# Patient Record
Sex: Male | Born: 1950 | Race: White | Hispanic: No | Marital: Single
Health system: Southern US, Community
[De-identification: ages and names within clinical notes are randomized; demographics above are authoritative.]

---

## 2004-10-01 ENCOUNTER — Ambulatory Visit: Payer: Self-pay | Admitting: Otolaryngology

## 2004-10-12 ENCOUNTER — Ambulatory Visit: Payer: Self-pay | Admitting: Radiation Oncology

## 2004-10-12 ENCOUNTER — Ambulatory Visit: Payer: Self-pay | Admitting: Otolaryngology

## 2004-10-16 ENCOUNTER — Ambulatory Visit: Payer: Self-pay | Admitting: Oncology

## 2004-10-28 ENCOUNTER — Ambulatory Visit: Payer: Self-pay | Admitting: Radiation Oncology

## 2004-11-28 ENCOUNTER — Ambulatory Visit: Payer: Self-pay | Admitting: Radiation Oncology

## 2004-12-28 ENCOUNTER — Ambulatory Visit: Payer: Self-pay | Admitting: Radiation Oncology

## 2005-04-13 ENCOUNTER — Ambulatory Visit: Payer: Self-pay | Admitting: Oncology

## 2005-04-30 ENCOUNTER — Ambulatory Visit: Payer: Self-pay | Admitting: Oncology

## 2005-06-09 ENCOUNTER — Ambulatory Visit: Payer: Self-pay | Admitting: Otolaryngology

## 2007-08-29 ENCOUNTER — Ambulatory Visit: Payer: Self-pay | Admitting: Otolaryngology

## 2010-03-13 ENCOUNTER — Ambulatory Visit: Payer: Self-pay | Admitting: Otolaryngology

## 2010-03-18 ENCOUNTER — Inpatient Hospital Stay: Payer: Self-pay | Admitting: Otolaryngology

## 2011-03-30 ENCOUNTER — Ambulatory Visit: Payer: Self-pay | Admitting: Otolaryngology

## 2011-04-05 ENCOUNTER — Ambulatory Visit: Payer: Self-pay | Admitting: Otolaryngology

## 2011-04-13 ENCOUNTER — Ambulatory Visit: Payer: Self-pay | Admitting: Radiation Oncology

## 2011-04-13 ENCOUNTER — Ambulatory Visit: Payer: Self-pay | Admitting: Otolaryngology

## 2011-04-20 ENCOUNTER — Ambulatory Visit: Payer: Self-pay | Admitting: Otolaryngology

## 2011-04-22 ENCOUNTER — Inpatient Hospital Stay: Payer: Self-pay | Admitting: Otolaryngology

## 2011-04-29 LAB — PATHOLOGY REPORT

## 2011-05-01 ENCOUNTER — Ambulatory Visit: Payer: Self-pay | Admitting: Radiation Oncology

## 2011-05-09 ENCOUNTER — Ambulatory Visit: Payer: Self-pay | Admitting: Radiation Oncology

## 2011-06-28 ENCOUNTER — Ambulatory Visit: Payer: Self-pay | Admitting: Otolaryngology

## 2011-09-03 ENCOUNTER — Ambulatory Visit: Payer: Self-pay | Admitting: Otolaryngology

## 2011-09-03 LAB — CBC WITH DIFFERENTIAL/PLATELET
Basophil #: 0 10*3/uL (ref 0.0–0.1)
Eosinophil %: 2 %
HCT: 38.2 % — ABNORMAL LOW (ref 40.0–52.0)
HGB: 12.2 g/dL — ABNORMAL LOW (ref 13.0–18.0)
Lymphocyte #: 0.8 10*3/uL — ABNORMAL LOW (ref 1.0–3.6)
Lymphocyte %: 13.4 %
MCH: 26.7 pg (ref 26.0–34.0)
MCV: 84 fL (ref 80–100)
Monocyte #: 0.5 10*3/uL (ref 0.0–0.7)
Monocyte %: 8 %
Neutrophil #: 4.5 10*3/uL (ref 1.4–6.5)
Platelet: 339 10*3/uL (ref 150–440)
RBC: 4.56 10*6/uL (ref 4.40–5.90)
WBC: 5.9 10*3/uL (ref 3.8–10.6)

## 2011-09-03 LAB — BASIC METABOLIC PANEL
Calcium, Total: 9.6 mg/dL (ref 8.5–10.1)
Chloride: 101 mmol/L (ref 98–107)
Co2: 32 mmol/L (ref 21–32)
Creatinine: 0.77 mg/dL (ref 0.60–1.30)
EGFR (Non-African Amer.): >60
Sodium: 141 mmol/L (ref 136–145)

## 2011-09-03 LAB — TSH: Thyroid Stimulating Horm: 8.34 u[IU]/mL — ABNORMAL HIGH

## 2011-11-03 ENCOUNTER — Ambulatory Visit: Payer: Self-pay | Admitting: Otolaryngology

## 2011-11-03 LAB — CBC WITH DIFFERENTIAL/PLATELET
Basophil #: 0 10*3/uL (ref 0.0–0.1)
Basophil %: 0.3 %
Eosinophil %: 2 %
HCT: 35.9 % — ABNORMAL LOW (ref 40.0–52.0)
HGB: 11.5 g/dL — ABNORMAL LOW (ref 13.0–18.0)
Lymphocyte #: 0.7 10*3/uL — ABNORMAL LOW (ref 1.0–3.6)
MCH: 26.4 pg (ref 26.0–34.0)
MCHC: 32 g/dL (ref 32.0–36.0)
MCV: 83 fL (ref 80–100)
Monocyte #: 0.6 10*3/uL (ref 0.0–0.7)
Monocyte %: 8.9 %
Neutrophil #: 5 10*3/uL (ref 1.4–6.5)
Platelet: 304 10*3/uL (ref 150–440)
RBC: 4.34 10*6/uL — ABNORMAL LOW (ref 4.40–5.90)
WBC: 6.4 10*3/uL (ref 3.8–10.6)

## 2011-11-03 LAB — BASIC METABOLIC PANEL
Anion Gap: 11 (ref 7–16)
Chloride: 104 mmol/L (ref 98–107)
Co2: 30 mmol/L (ref 21–32)
Creatinine: 0.79 mg/dL (ref 0.60–1.30)
Glucose: 115 mg/dL — ABNORMAL HIGH (ref 65–99)
Osmolality: 294 (ref 275–301)
Potassium: 4 mmol/L (ref 3.5–5.1)

## 2011-11-08 ENCOUNTER — Inpatient Hospital Stay: Payer: Self-pay | Admitting: Otolaryngology

## 2011-11-09 LAB — CBC WITH DIFFERENTIAL/PLATELET
Basophil %: 0.2 %
Eosinophil #: 0.1 10*3/uL (ref 0.0–0.7)
Eosinophil %: 0.8 %
HCT: 33.8 % — ABNORMAL LOW (ref 40.0–52.0)
Lymphocyte #: 0.8 10*3/uL — ABNORMAL LOW (ref 1.0–3.6)
Lymphocyte %: 10.5 %
MCH: 26.8 pg (ref 26.0–34.0)
MCHC: 32.4 g/dL (ref 32.0–36.0)
MCV: 83 fL (ref 80–100)
Neutrophil #: 5.7 10*3/uL (ref 1.4–6.5)
Neutrophil %: 79.7 %
Platelet: 279 10*3/uL (ref 150–440)

## 2011-11-09 LAB — BASIC METABOLIC PANEL
BUN: 10 mg/dL (ref 7–18)
Chloride: 100 mmol/L (ref 98–107)
Co2: 31 mmol/L (ref 21–32)
Osmolality: 278 (ref 275–301)
Potassium: 3.8 mmol/L (ref 3.5–5.1)
Sodium: 139 mmol/L (ref 136–145)

## 2011-11-10 LAB — PATHOLOGY REPORT

## 2011-11-24 ENCOUNTER — Ambulatory Visit: Payer: Self-pay | Admitting: Oncology

## 2011-11-29 ENCOUNTER — Ambulatory Visit: Payer: Self-pay | Admitting: Oncology

## 2011-12-01 ENCOUNTER — Ambulatory Visit: Payer: Self-pay | Admitting: Oncology

## 2011-12-02 ENCOUNTER — Ambulatory Visit: Payer: Self-pay | Admitting: Oncology

## 2011-12-13 LAB — CBC CANCER CENTER
Basophil #: 0 x10 3/mm (ref 0.0–0.1)
Basophil %: 0.5 %
Eosinophil #: 0.2 x10 3/mm (ref 0.0–0.7)
HCT: 38.1 % — ABNORMAL LOW (ref 40.0–52.0)
Lymphocyte #: 0.7 x10 3/mm — ABNORMAL LOW (ref 1.0–3.6)
MCH: 26.5 pg (ref 26.0–34.0)
MCHC: 32 g/dL (ref 32.0–36.0)
MCV: 83 fL (ref 80–100)
Neutrophil #: 3.7 x10 3/mm (ref 1.4–6.5)
Neutrophil %: 71.5 %
RBC: 4.6 10*6/uL (ref 4.40–5.90)
WBC: 5.2 x10 3/mm (ref 3.8–10.6)

## 2011-12-13 LAB — BASIC METABOLIC PANEL
Calcium, Total: 9.5 mg/dL (ref 8.5–10.1)
Chloride: 103 mmol/L (ref 98–107)
Co2: 32 mmol/L (ref 21–32)
EGFR (Non-African Amer.): >60
Glucose: 102 mg/dL — ABNORMAL HIGH (ref 65–99)
Osmolality: 285 (ref 275–301)
Sodium: 141 mmol/L (ref 136–145)

## 2011-12-13 LAB — MAGNESIUM: Magnesium: 1.7 mg/dL — ABNORMAL LOW

## 2011-12-16 LAB — COMPREHENSIVE METABOLIC PANEL
Albumin: 3.7 g/dL (ref 3.4–5.0)
Alkaline Phosphatase: 81 U/L (ref 50–136)
Anion Gap: 6 — ABNORMAL LOW (ref 7–16)
Chloride: 102 mmol/L (ref 98–107)
Co2: 31 mmol/L (ref 21–32)
EGFR (Non-African Amer.): >60
Glucose: 94 mg/dL (ref 65–99)
Potassium: 4.1 mmol/L (ref 3.5–5.1)
SGOT(AST): 32 U/L (ref 15–37)

## 2011-12-16 LAB — CBC CANCER CENTER
Basophil #: 0 x10 3/mm (ref 0.0–0.1)
Basophil %: 0.7 %
Eosinophil #: 0.2 x10 3/mm (ref 0.0–0.7)
MCV: 83 fL (ref 80–100)
RDW: 18.5 % — ABNORMAL HIGH (ref 11.5–14.5)
WBC: 5 x10 3/mm (ref 3.8–10.6)

## 2011-12-23 LAB — CBC CANCER CENTER
Basophil #: 0 x10 3/mm (ref 0.0–0.1)
Basophil %: 0.4 %
Eosinophil #: 0.1 x10 3/mm (ref 0.0–0.7)
HCT: 38 % — ABNORMAL LOW (ref 40.0–52.0)
Lymphocyte %: 12.9 %
MCHC: 32 g/dL (ref 32.0–36.0)
MCV: 82 fL (ref 80–100)
Neutrophil #: 4.8 x10 3/mm (ref 1.4–6.5)
Neutrophil %: 75.6 %
Platelet: 246 x10 3/mm (ref 150–440)
RBC: 4.63 10*6/uL (ref 4.40–5.90)
RDW: 18.9 % — ABNORMAL HIGH (ref 11.5–14.5)
WBC: 6.3 x10 3/mm (ref 3.8–10.6)

## 2011-12-29 ENCOUNTER — Ambulatory Visit: Payer: Self-pay | Admitting: Oncology

## 2011-12-30 LAB — CBC CANCER CENTER
Basophil #: 0 x10 3/mm (ref 0.0–0.1)
Eosinophil #: 0.1 x10 3/mm (ref 0.0–0.7)
Eosinophil %: 2 %
HCT: 37.3 % — ABNORMAL LOW (ref 40.0–52.0)
HGB: 12 g/dL — ABNORMAL LOW (ref 13.0–18.0)
Lymphocyte #: 0.6 x10 3/mm — ABNORMAL LOW (ref 1.0–3.6)
Lymphocyte %: 9.7 %
MCH: 26.8 pg (ref 26.0–34.0)
MCV: 83 fL (ref 80–100)
Monocyte #: 0.5 x10 3/mm (ref 0.2–1.0)
Neutrophil #: 4.5 x10 3/mm (ref 1.4–6.5)
Neutrophil %: 79.5 %
RBC: 4.48 10*6/uL (ref 4.40–5.90)
RDW: 19.5 % — ABNORMAL HIGH (ref 11.5–14.5)
WBC: 5.7 x10 3/mm (ref 3.8–10.6)

## 2012-01-06 LAB — COMPREHENSIVE METABOLIC PANEL
Alkaline Phosphatase: 100 U/L (ref 50–136)
Bilirubin,Total: 0.2 mg/dL (ref 0.2–1.0)
Chloride: 102 mmol/L (ref 98–107)
Co2: 33 mmol/L — ABNORMAL HIGH (ref 21–32)
EGFR (African American): >60
EGFR (Non-African Amer.): >60
Osmolality: 290 (ref 275–301)
Potassium: 3.6 mmol/L (ref 3.5–5.1)
SGOT(AST): 27 U/L (ref 15–37)
Sodium: 144 mmol/L (ref 136–145)
Total Protein: 7.2 g/dL (ref 6.4–8.2)

## 2012-01-06 LAB — CBC CANCER CENTER
Eosinophil #: 0.1 x10 3/mm (ref 0.0–0.7)
Eosinophil %: 3.3 %
HCT: 38.8 % — ABNORMAL LOW (ref 40.0–52.0)
Lymphocyte #: 0.6 x10 3/mm — ABNORMAL LOW (ref 1.0–3.6)
Lymphocyte %: 17.3 %
MCH: 26.9 pg (ref 26.0–34.0)
MCHC: 32.2 g/dL (ref 32.0–36.0)
MCV: 84 fL (ref 80–100)
Monocyte #: 0.6 x10 3/mm (ref 0.2–1.0)
Monocyte %: 15.8 %
Neutrophil %: 62.8 %
Platelet: 235 x10 3/mm (ref 150–440)
RBC: 4.64 10*6/uL (ref 4.40–5.90)
RDW: 19.6 % — ABNORMAL HIGH (ref 11.5–14.5)
WBC: 3.7 x10 3/mm — ABNORMAL LOW (ref 3.8–10.6)

## 2012-01-12 LAB — COMPREHENSIVE METABOLIC PANEL
Albumin: 3.6 g/dL (ref 3.4–5.0)
Alkaline Phosphatase: 106 U/L (ref 50–136)
Anion Gap: 7 (ref 7–16)
BUN: 25 mg/dL — ABNORMAL HIGH (ref 7–18)
Calcium, Total: 9 mg/dL (ref 8.5–10.1)
Co2: 30 mmol/L (ref 21–32)
Creatinine: 0.8 mg/dL (ref 0.60–1.30)
EGFR (African American): >60
Potassium: 4.3 mmol/L (ref 3.5–5.1)
SGPT (ALT): 54 U/L
Sodium: 138 mmol/L (ref 136–145)

## 2012-01-12 LAB — CBC CANCER CENTER
Basophil %: 0.6 %
Eosinophil #: 0.1 x10 3/mm (ref 0.0–0.7)
Lymphocyte #: 0.8 x10 3/mm — ABNORMAL LOW (ref 1.0–3.6)
Lymphocyte %: 15.4 %
MCH: 27 pg (ref 26.0–34.0)
MCHC: 32.8 g/dL (ref 32.0–36.0)
MCV: 82 fL (ref 80–100)
Monocyte #: 0.5 x10 3/mm (ref 0.2–1.0)
Platelet: 206 x10 3/mm (ref 150–440)
RDW: 19.4 % — ABNORMAL HIGH (ref 11.5–14.5)

## 2012-01-20 LAB — CBC CANCER CENTER
Basophil #: 0 x10 3/mm (ref 0.0–0.1)
Basophil %: 0.4 %
HCT: 36.6 % — ABNORMAL LOW (ref 40.0–52.0)
HGB: 11.9 g/dL — ABNORMAL LOW (ref 13.0–18.0)
Lymphocyte #: 0.6 x10 3/mm — ABNORMAL LOW (ref 1.0–3.6)
Lymphocyte %: 13.8 %
MCH: 27.2 pg (ref 26.0–34.0)
Monocyte %: 12.1 %
Neutrophil #: 3.1 x10 3/mm (ref 1.4–6.5)
Platelet: 158 x10 3/mm (ref 150–440)
RDW: 19.8 % — ABNORMAL HIGH (ref 11.5–14.5)
WBC: 4.3 x10 3/mm (ref 3.8–10.6)

## 2012-01-27 LAB — CBC CANCER CENTER
Basophil %: 0.5 %
Eosinophil %: 2.1 %
HCT: 36.6 % — ABNORMAL LOW (ref 40.0–52.0)
HGB: 11.8 g/dL — ABNORMAL LOW (ref 13.0–18.0)
Lymphocyte #: 0.6 x10 3/mm — ABNORMAL LOW (ref 1.0–3.6)
Lymphocyte %: 20.7 %
MCHC: 32.3 g/dL (ref 32.0–36.0)
MCV: 84 fL (ref 80–100)
Monocyte #: 0.5 x10 3/mm (ref 0.2–1.0)
Monocyte %: 17.7 %
Platelet: 210 x10 3/mm (ref 150–440)
RBC: 4.35 10*6/uL — ABNORMAL LOW (ref 4.40–5.90)
WBC: 2.7 x10 3/mm — ABNORMAL LOW (ref 3.8–10.6)

## 2012-01-29 ENCOUNTER — Ambulatory Visit: Payer: Self-pay | Admitting: Oncology

## 2012-01-31 ENCOUNTER — Ambulatory Visit: Payer: Self-pay | Admitting: Oncology

## 2012-02-03 LAB — CBC CANCER CENTER
Basophil %: 0.3 %
Eosinophil #: 0.1 x10 3/mm (ref 0.0–0.7)
Eosinophil %: 1.4 %
Lymphocyte #: 0.7 x10 3/mm — ABNORMAL LOW (ref 1.0–3.6)
Lymphocyte %: 18.8 %
MCH: 27.5 pg (ref 26.0–34.0)
MCHC: 32.5 g/dL (ref 32.0–36.0)
Monocyte #: 0.5 x10 3/mm (ref 0.2–1.0)
Neutrophil #: 2.6 x10 3/mm (ref 1.4–6.5)
RBC: 4.4 10*6/uL (ref 4.40–5.90)
WBC: 4 x10 3/mm (ref 3.8–10.6)

## 2012-02-03 LAB — COMPREHENSIVE METABOLIC PANEL
Alkaline Phosphatase: 103 U/L (ref 50–136)
BUN: 23 mg/dL — ABNORMAL HIGH (ref 7–18)
Bilirubin,Total: 0.2 mg/dL (ref 0.2–1.0)
Chloride: 104 mmol/L (ref 98–107)
Co2: 31 mmol/L (ref 21–32)
Osmolality: 287 (ref 275–301)
Potassium: 4.1 mmol/L (ref 3.5–5.1)
SGPT (ALT): 33 U/L

## 2012-02-15 ENCOUNTER — Ambulatory Visit: Payer: Self-pay | Admitting: Otolaryngology

## 2012-02-21 ENCOUNTER — Ambulatory Visit: Payer: Self-pay | Admitting: Otolaryngology

## 2012-02-28 ENCOUNTER — Ambulatory Visit: Payer: Self-pay | Admitting: Oncology

## 2012-03-30 ENCOUNTER — Ambulatory Visit: Payer: Self-pay | Admitting: Oncology

## 2012-04-05 ENCOUNTER — Ambulatory Visit: Payer: Self-pay | Admitting: Oncology

## 2012-04-06 ENCOUNTER — Ambulatory Visit: Payer: Self-pay | Admitting: Oncology

## 2012-04-29 ENCOUNTER — Emergency Department: Payer: Self-pay | Admitting: Emergency Medicine

## 2012-04-29 LAB — CBC
HGB: 13.1 g/dL (ref 13.0–18.0)
MCH: 29.9 pg (ref 26.0–34.0)
MCV: 88 fL (ref 80–100)
RBC: 4.39 10*6/uL — ABNORMAL LOW (ref 4.40–5.90)
RDW: 14.9 % — ABNORMAL HIGH (ref 11.5–14.5)
WBC: 11.3 10*3/uL — ABNORMAL HIGH (ref 3.8–10.6)

## 2012-04-29 LAB — URINALYSIS, COMPLETE
Bilirubin,UR: NEGATIVE
Blood: NEGATIVE
Glucose,UR: NEGATIVE mg/dL (ref 0–75)
Leukocyte Esterase: NEGATIVE
Protein: NEGATIVE
RBC,UR: 2 /HPF (ref 0–5)
Squamous Epithelial: NONE SEEN
WBC UR: <1 /HPF (ref 0–5)

## 2012-04-29 LAB — COMPREHENSIVE METABOLIC PANEL
Alkaline Phosphatase: 103 U/L (ref 50–136)
Anion Gap: 8 (ref 7–16)
Bilirubin,Total: 0.2 mg/dL (ref 0.2–1.0)
Calcium, Total: 9.7 mg/dL (ref 8.5–10.1)
Chloride: 101 mmol/L (ref 98–107)
Co2: 29 mmol/L (ref 21–32)
Creatinine: 0.72 mg/dL (ref 0.60–1.30)
EGFR (African American): >60
EGFR (Non-African Amer.): >60
Glucose: 122 mg/dL — ABNORMAL HIGH (ref 65–99)
Osmolality: 280 (ref 275–301)
Potassium: 4.4 mmol/L (ref 3.5–5.1)
Sodium: 138 mmol/L (ref 136–145)

## 2012-04-30 ENCOUNTER — Ambulatory Visit: Payer: Self-pay | Admitting: Oncology

## 2012-05-04 LAB — WOUND AEROBIC CULTURE

## 2012-05-05 LAB — CULTURE, BLOOD (SINGLE)

## 2012-08-04 ENCOUNTER — Ambulatory Visit: Payer: Self-pay | Admitting: Oncology

## 2013-04-17 IMAGING — CR DG CHEST 2V
1 series · 3 of 3 positions shown · non-contrast
Comparison: none

REASON FOR EXAM: hx ca larynx
COMMENTS:

[Series 1: view not recorded · 0.17mm/px · 3 of 3 slices shown]
[im 1/3]
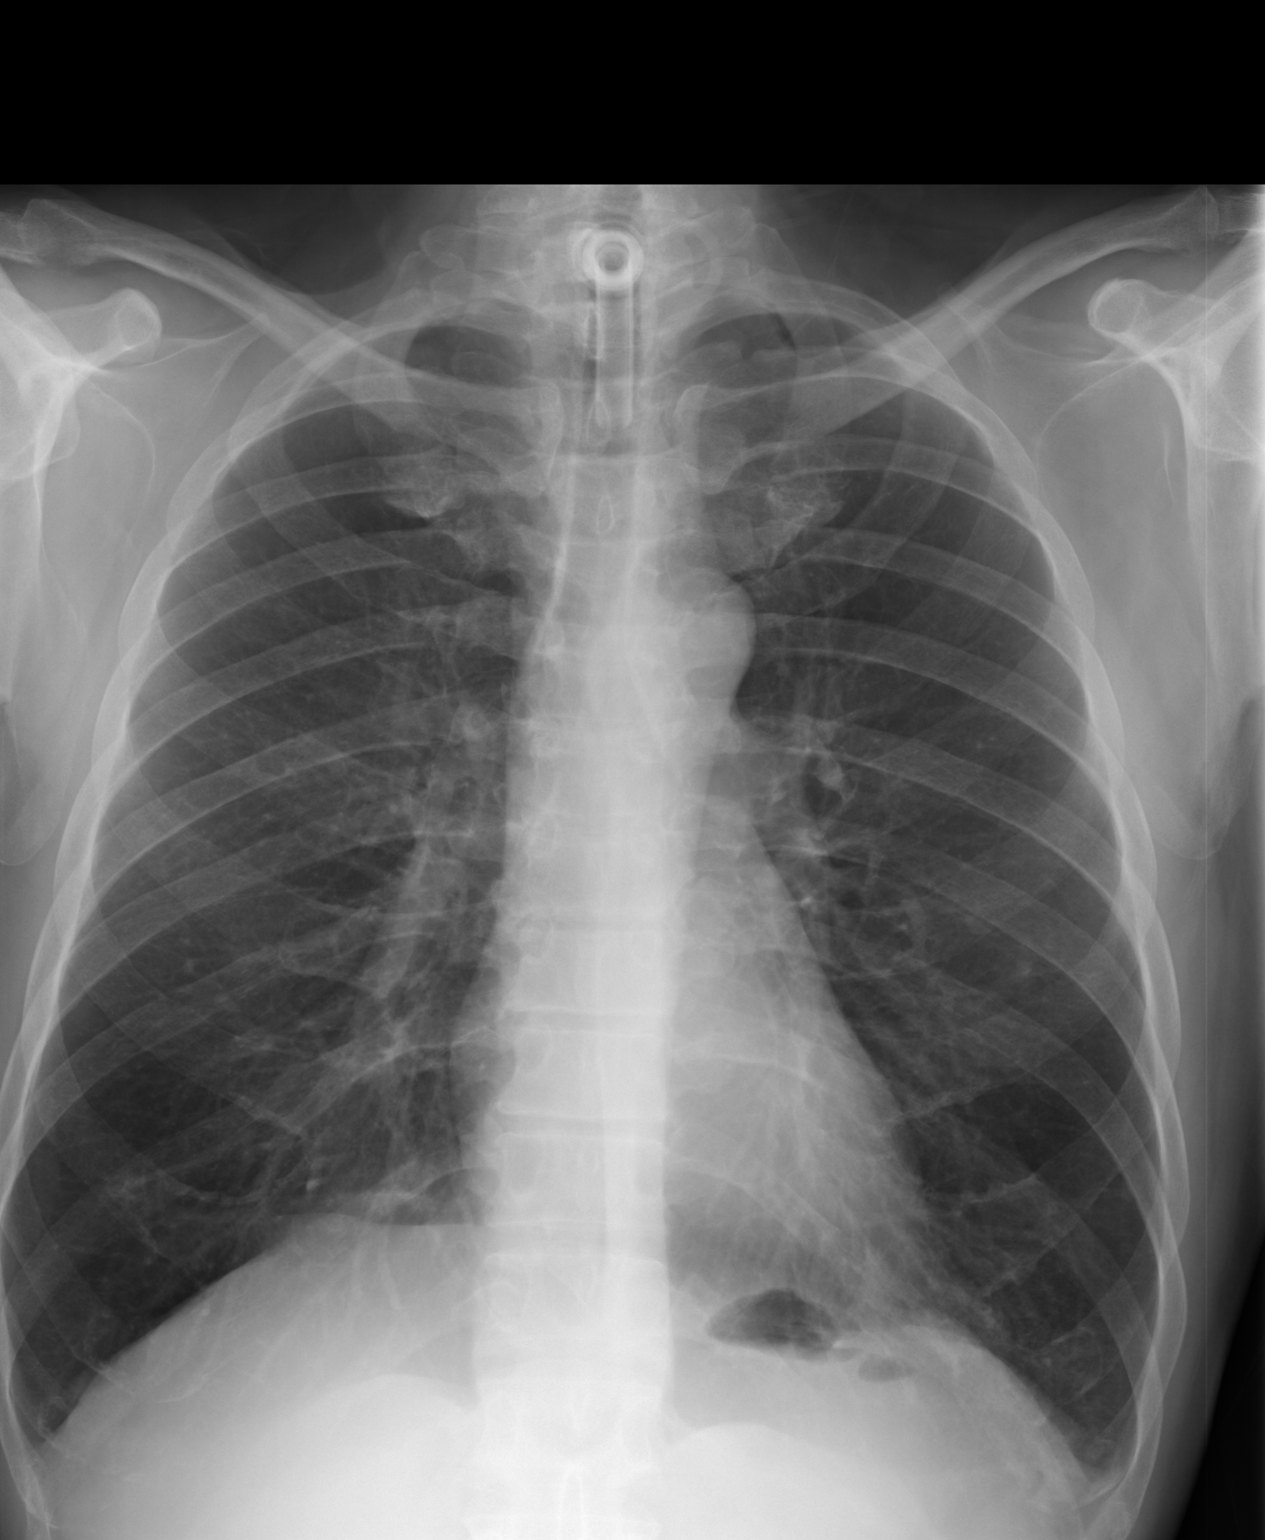
[im 2/3]
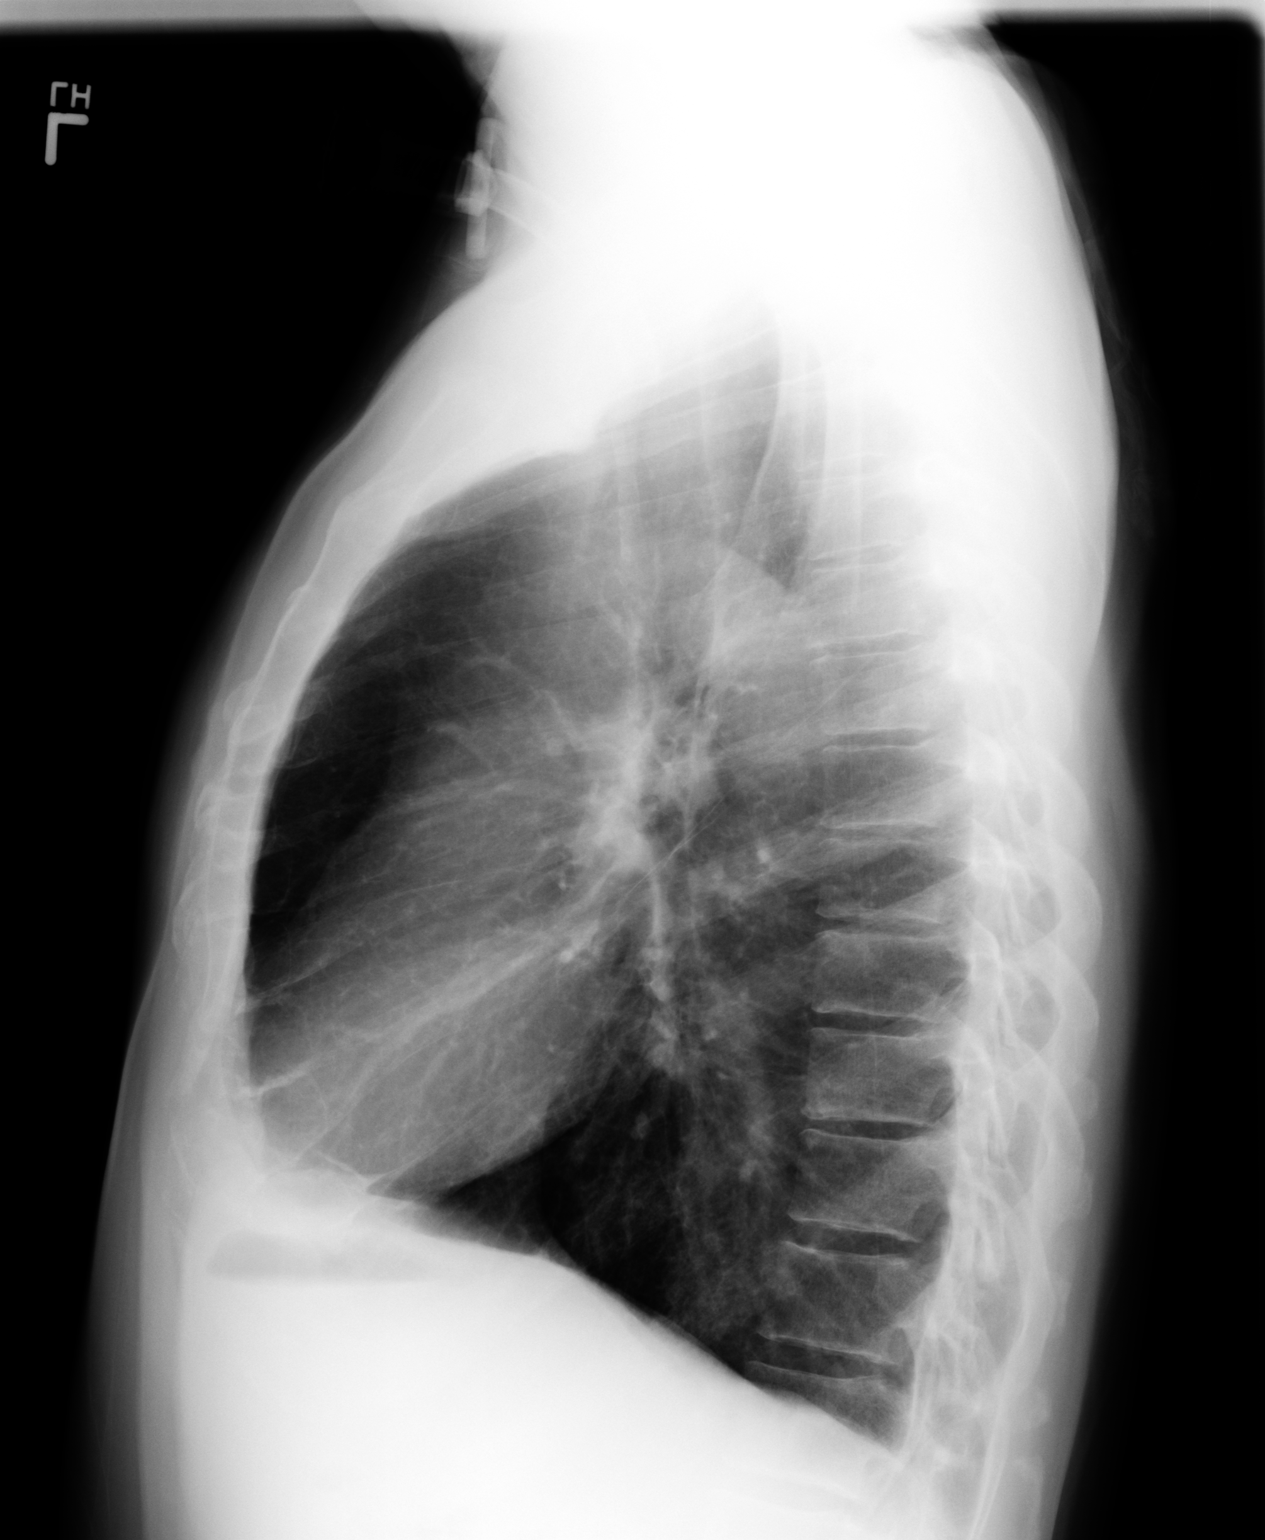
[im 3/3]
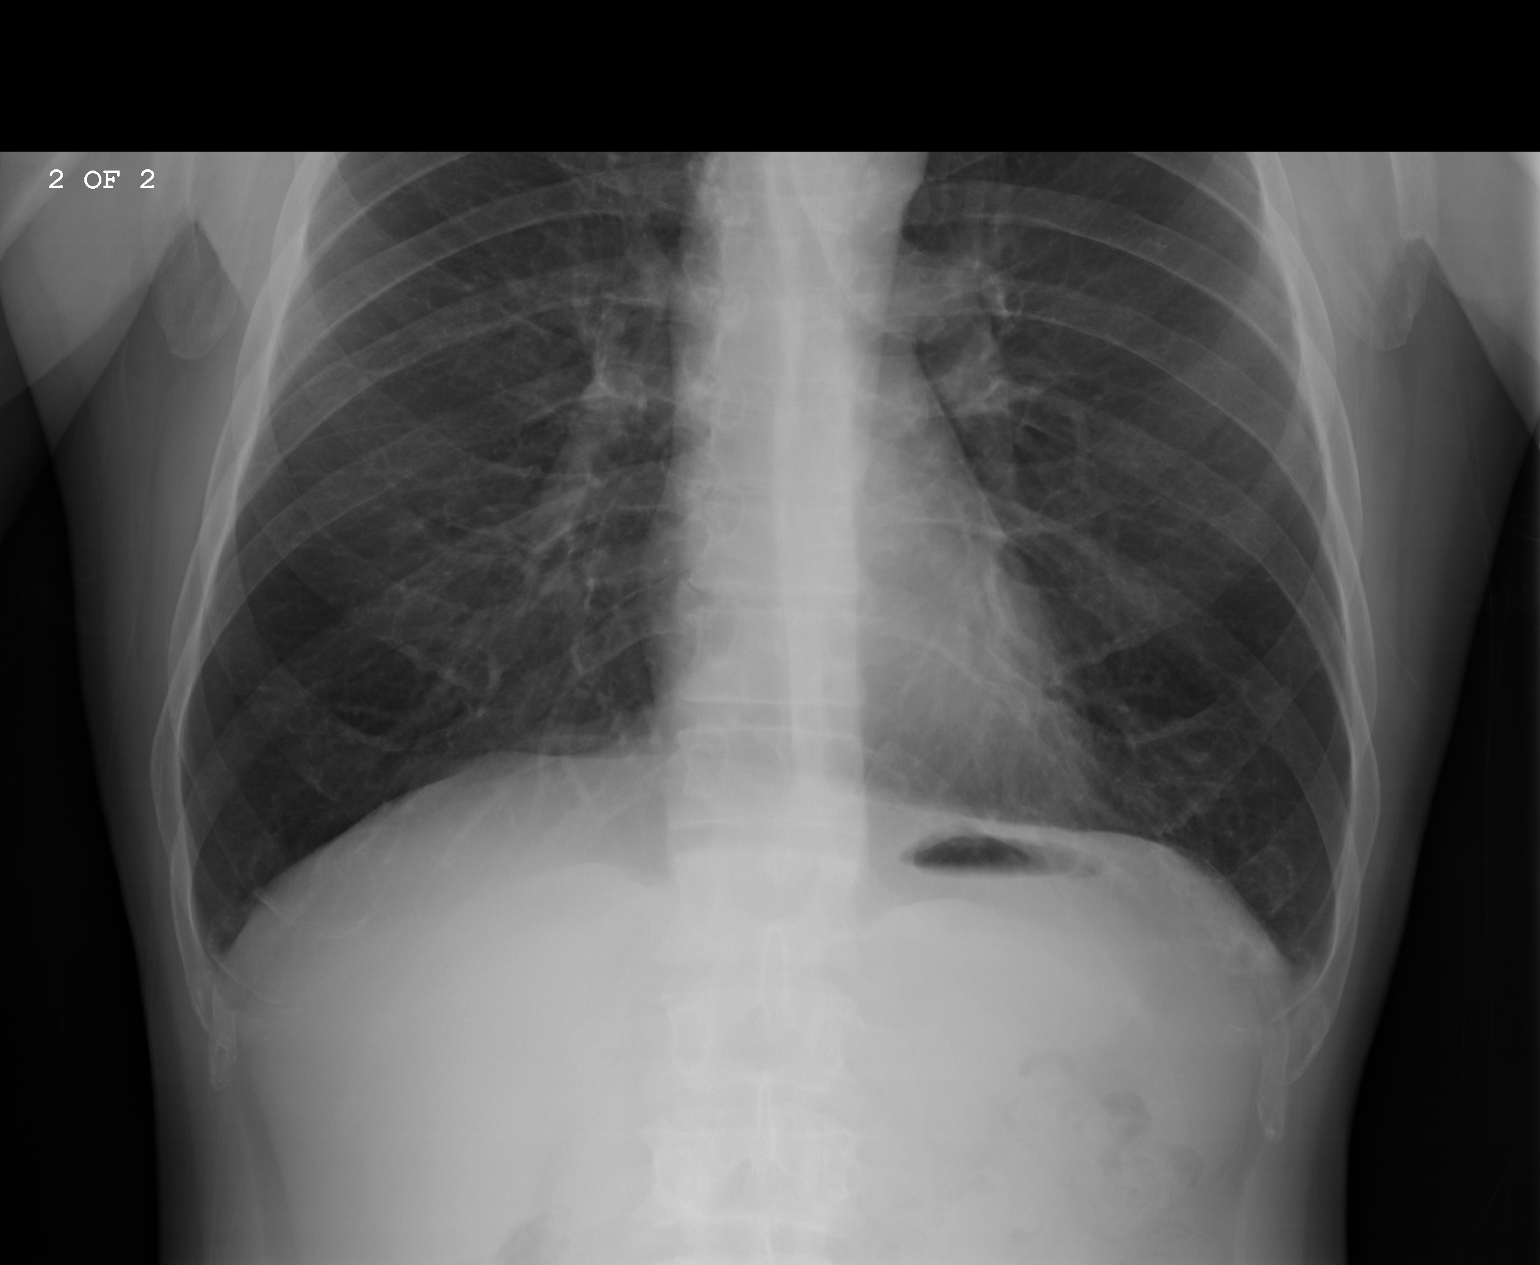

[3 of 3 positions shown; findings below may reference images not displayed]

PROCEDURE:     DXR - DXR CHEST PA (OR AP) AND LATERAL  - March 30, 2011 [DATE]

RESULT:     Comparison is made to the prior exam of 03/24/2010. The lung
fields are clear. No pneumonia, pneumothorax or pleural effusion is seen.
Heart size is normal. A tracheostomy is present. The chest is bilaterally
hyperexpanded consistent with a history of COPD.
IMPRESSION: 1. The lung fields are clear.
2. COPD.
3. A tracheostomy is again noted.
4. Heart size remains normal.

## 2013-05-01 IMAGING — PT NM PET TUM IMG RESTAG (PS) SKULL BASE T - THIGH
1 series · 1 of 1 positions shown · non-contrast
Comparison: none

REASON FOR EXAM: larynx carcinoma
COMMENTS:

PROCEDURE:     PET - PET/CT RESTAGING LARYNX CA  - April 13, 2011 [DATE]
RESULT:
HISTORY: Laryngeal cancer.
Comparison Study: No recent.

[Series 1004: mm oncology reading_mm oncology reading result ima · 1.31mm/px · 1 of 1 slices shown]
[im 1/1]
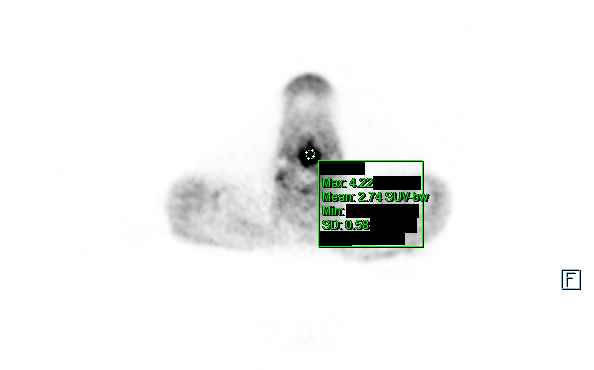

[1 of 1 positions shown; findings below may reference images not displayed]

FINDINGS: Following termination of a fasting blood sugar of 104 mg/dl,
mCi of F-18 FDG was administered to the patient and PET CT obtained. A
laryngeal mass is present with SUV levels up to 4 noted. The lesion appears
to involve the entire vocal cord region anteriorly and posteriorly.
Involvement of the adjacent anterior bony structures also appears present.
Near obstruction of the airway is noted. A tracheostomy tube is present. No
pathologic nodes are noted within the neck. The remainder of the PET CT is
unremarkable with no involvement in the chest, abdomen or pelvis.
IMPRESSION: 1. Findings suggesting laryngeal cancer with diffuse involvement of the
vocal cords and adjacent anterior thyroid cartilage. No evidence of nodal
disease noted. No distant metastases noted. SUV levels up to 4 are noted.

## 2013-05-22 IMAGING — CR DG CHEST 2V
1 series · 2 of 2 positions shown · non-contrast
Comparison: none

REASON FOR EXAM: infiltrate
COMMENTS:

PROCEDURE:     DXR - DXR CHEST PA (OR AP) AND LATERAL  - May 04, 2011  [DATE]
RESULT:     Comparison: 05/01/2011

[Series 1: view not recorded · 0.17mm/px · 2 of 2 slices shown]
[im 1/2]
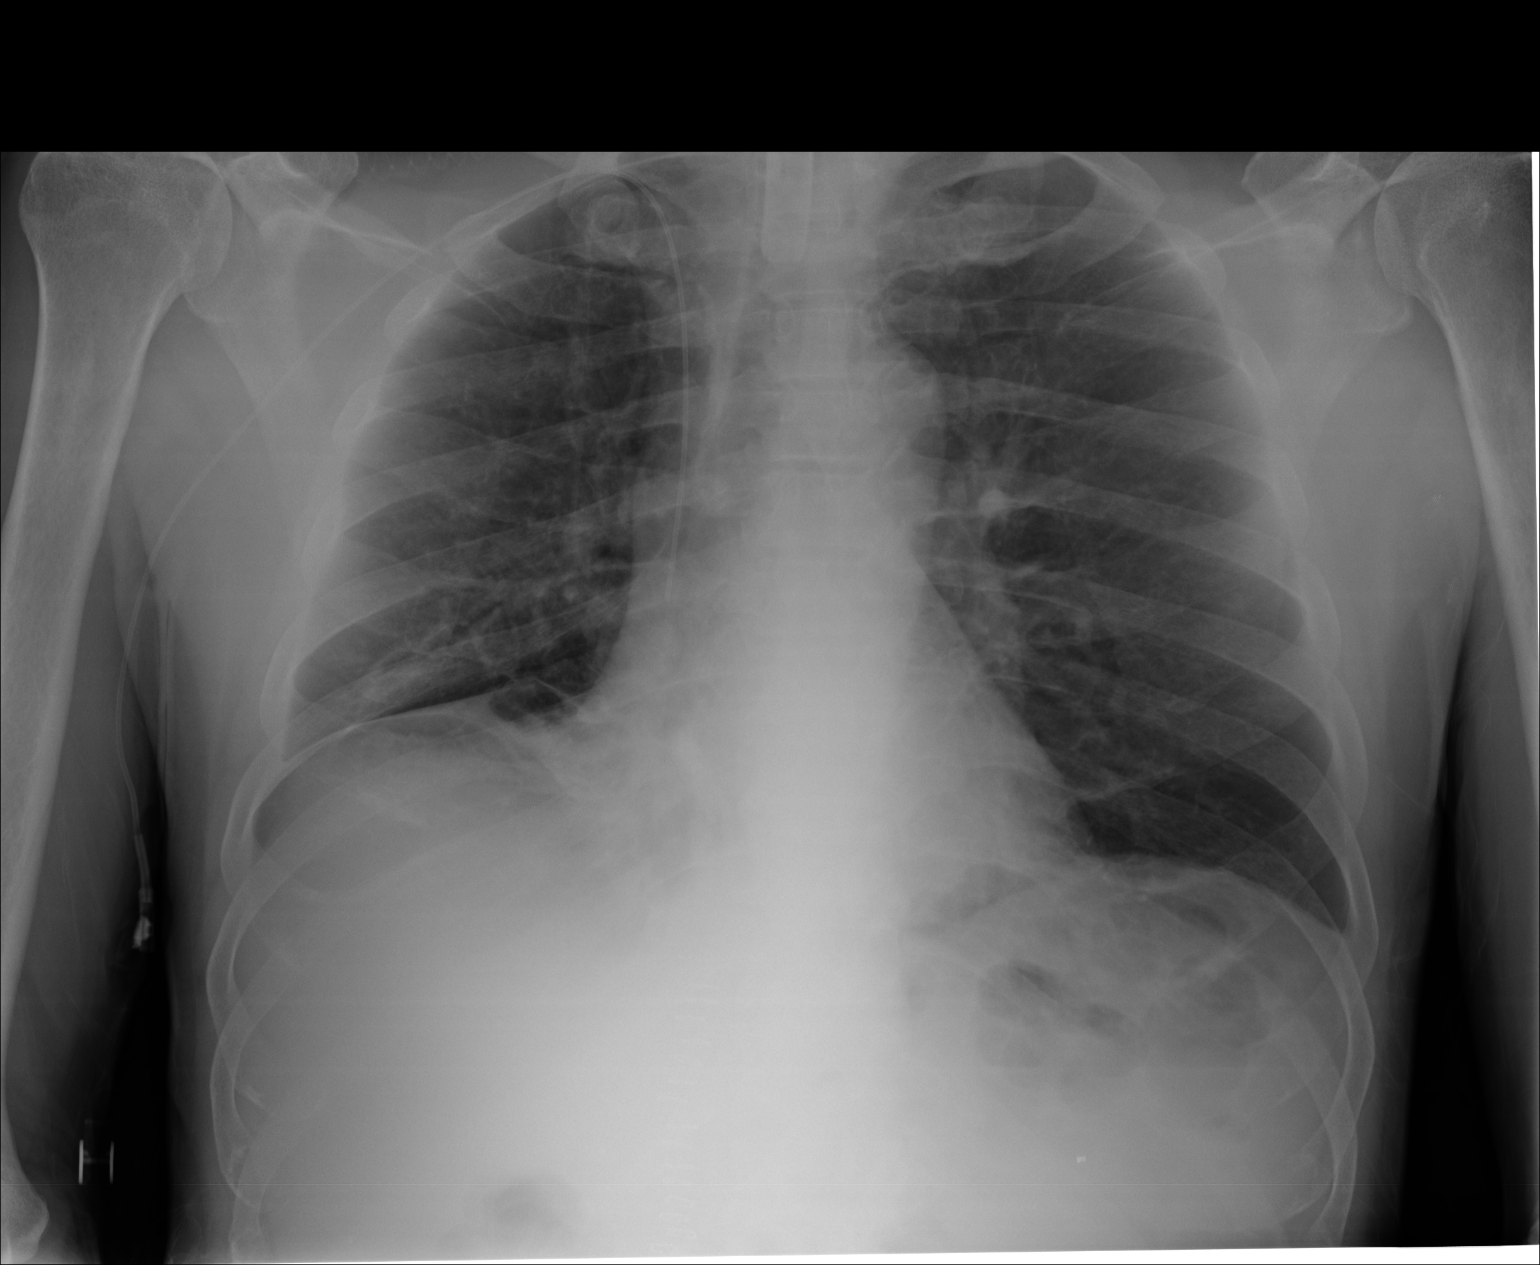
[im 2/2]
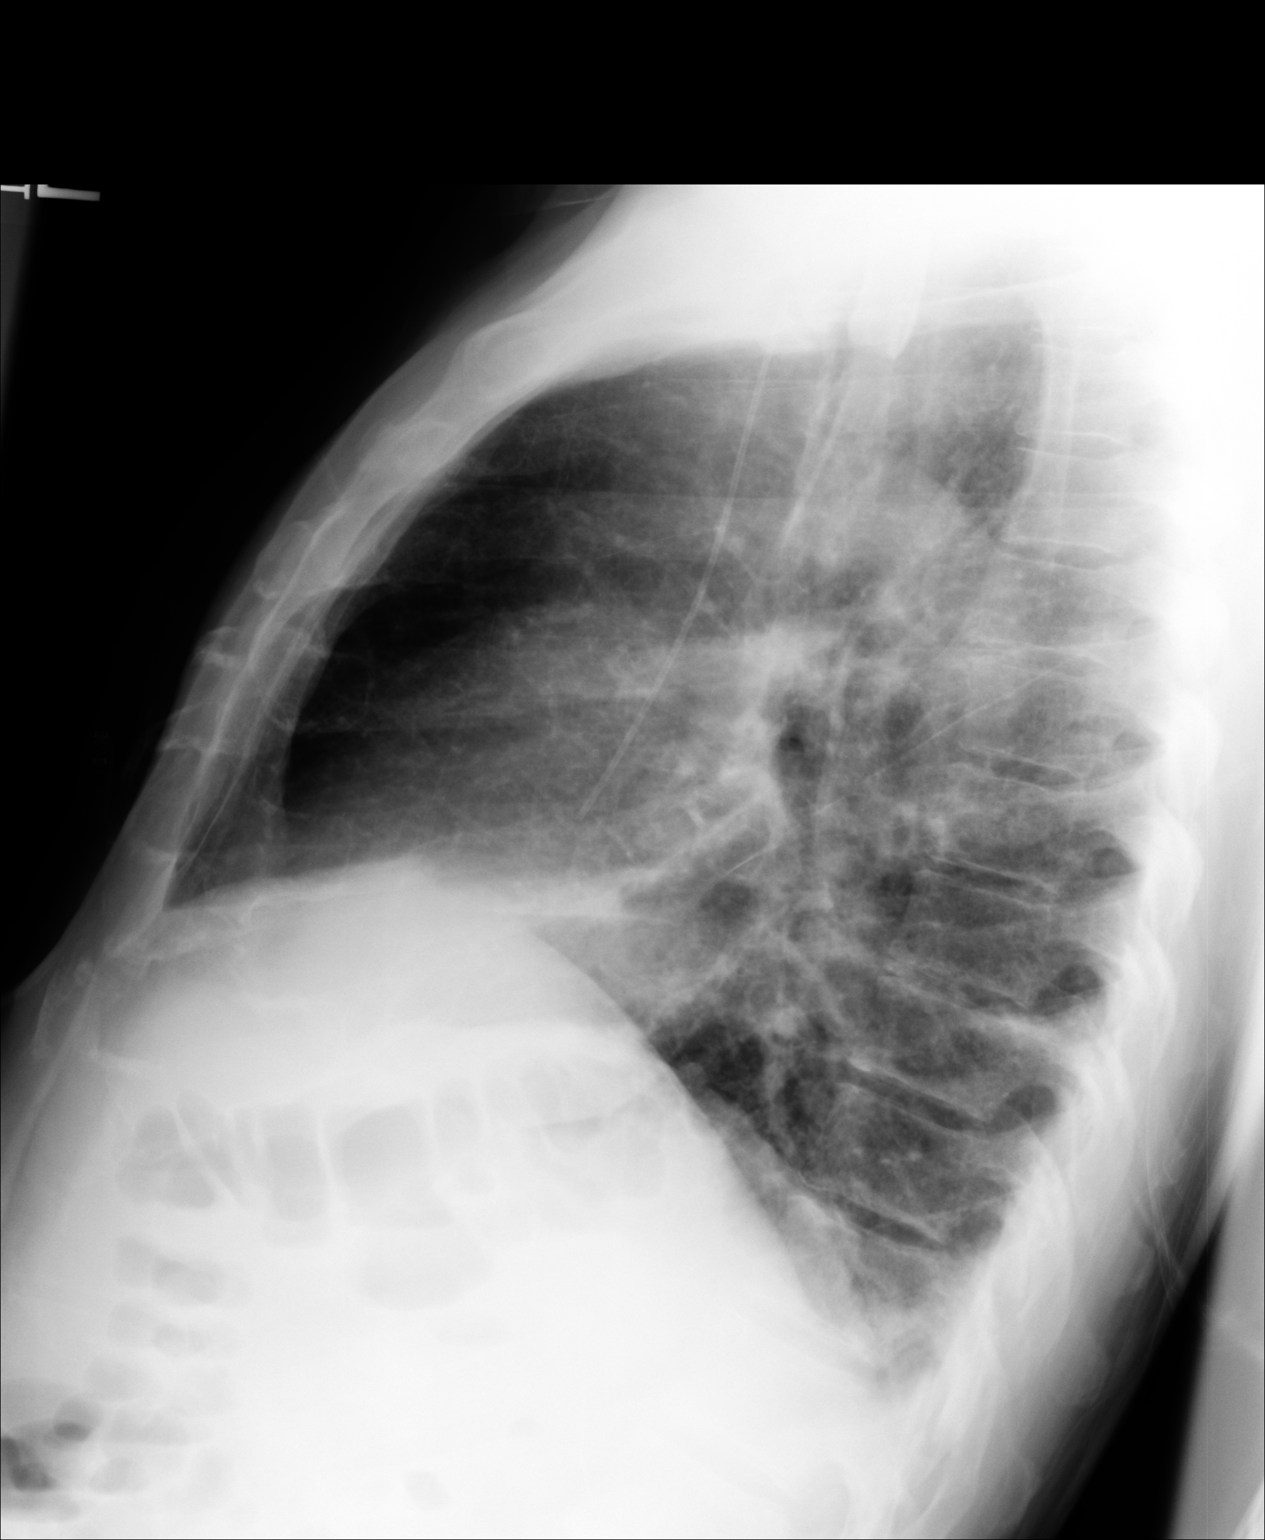

[2 of 2 positions shown; findings below may reference images not displayed]

FINDINGS: Right PICC catheter tip overlies the cavoatrial junction. Tracheostomy is in
place. Heart and mediastinum are stable. There are mild right basilar
opacities the left lung is clear. Lucency overlying the right hemidiaphragm
is felt to be related to the bandlike atelectasis.
IMPRESSION: 1. Mild right basilar opacities may represent a confluence of atelectasis
and small right pleural effusion. Infection not excluded. Followup
radiographs are recommended to ensure resolution.
2. Lucency overlying the right hemidiaphragm is felt to be artifactual
secondary to the bandlike atelectasis. If there is clinical concern for
pneumoperitoneum, a decubitus radiograph could be performed.

## 2013-12-24 IMAGING — CT CT SIM MISC
1 series · 12 of 14 positions shown, 15 images · non-contrast
Comparison: none

[Series 2: — · axial · 1.17mm/px · z∈[-620,-284]mm · 12 of 134 slices shown, 15 images]
[im 11/134  soft-tissue]
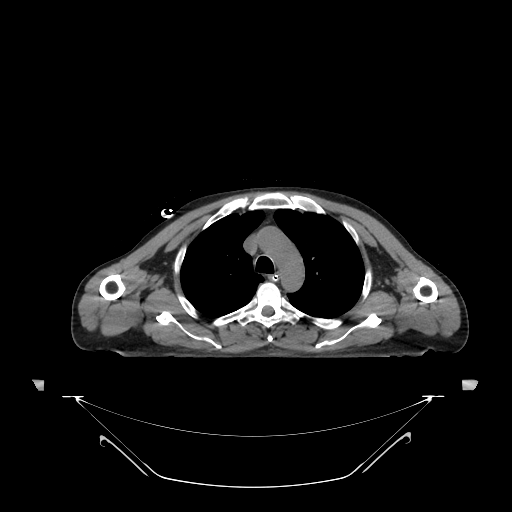
[im 11/134  bone]
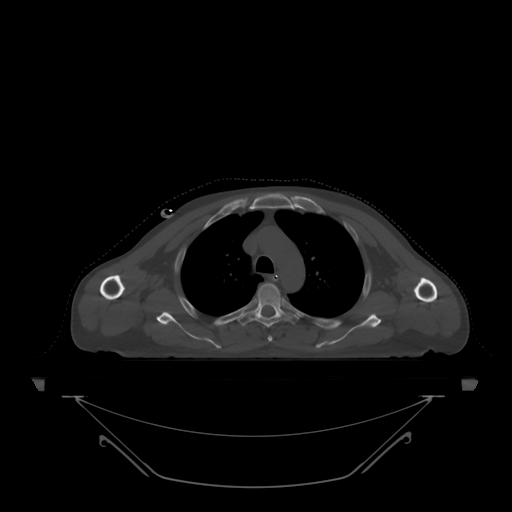
[im 21/134  bone]
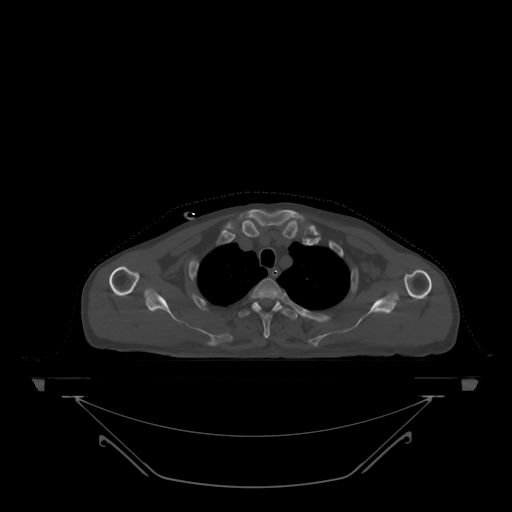
[im 31/134  bone]
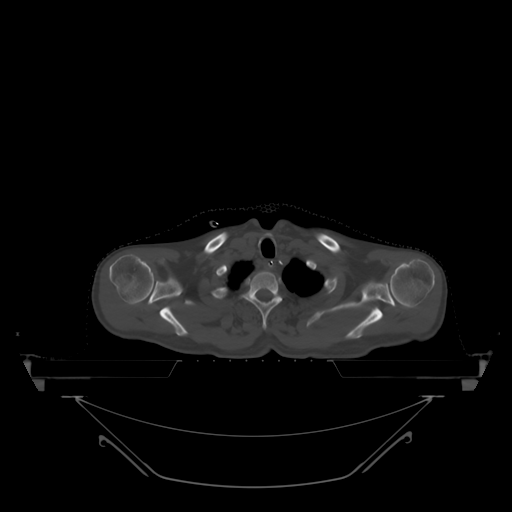
[im 41/134  bone]
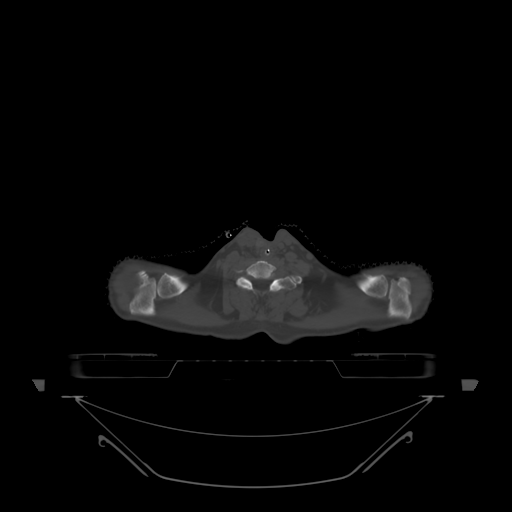
[im 52/134  soft-tissue]
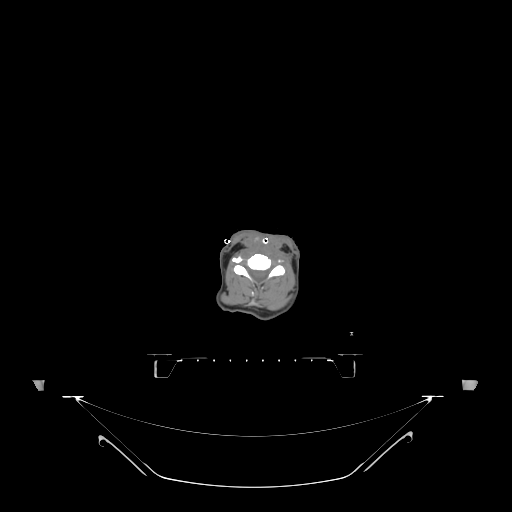
[im 52/134  bone]
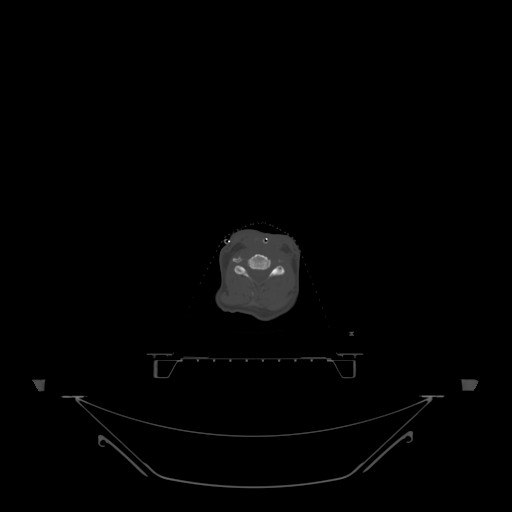
[im 62/134  bone]
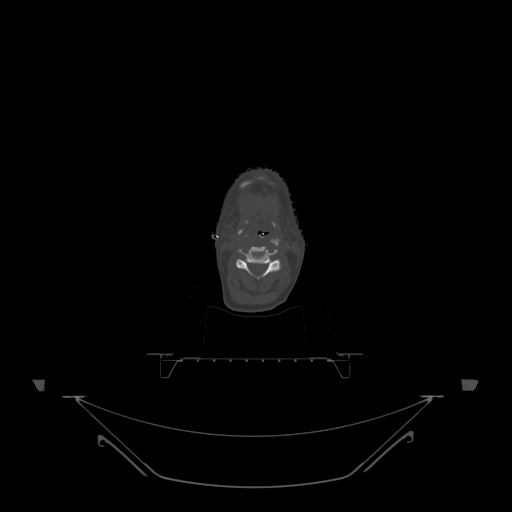
[im 72/134  bone]
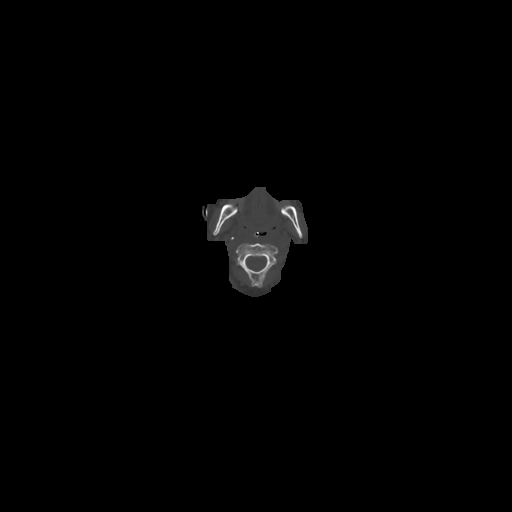
[im 82/134  bone]
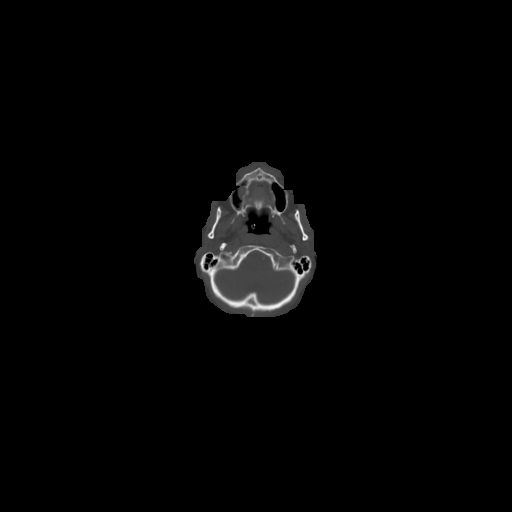
[im 93/134  soft-tissue]
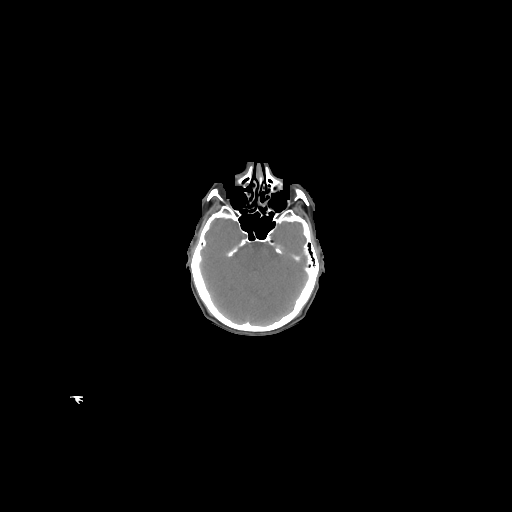
[im 93/134  bone]
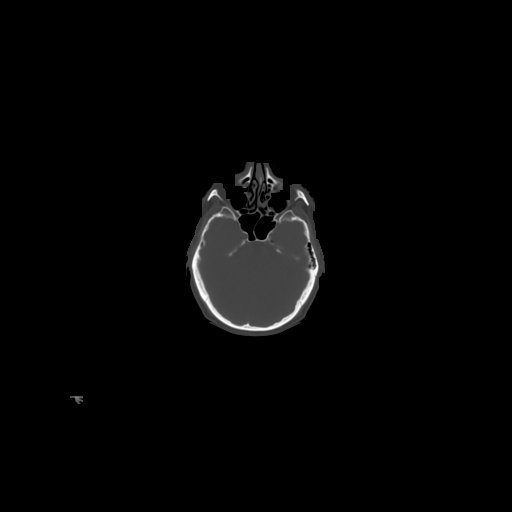
[im 103/134  bone]
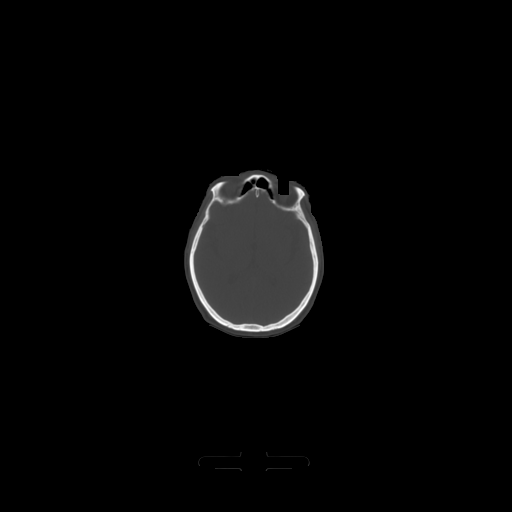
[im 113/134  bone]
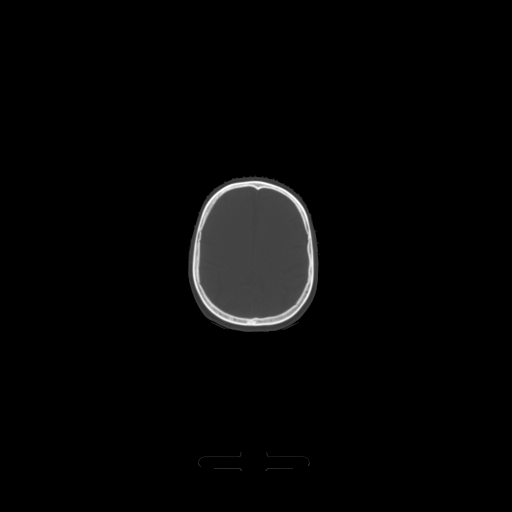
[im 123/134  bone]
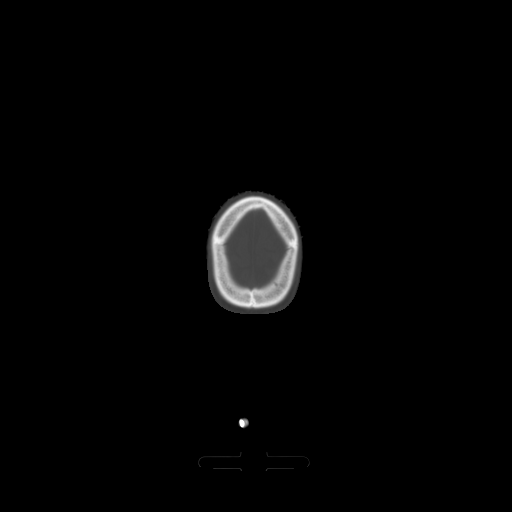

[12 of 14 positions shown; findings below may reference images not displayed]

IMAGES IMPORTED FROM THE [employer] WORKFLOW SYSTEM
NO DICTATION FOR STUDY

## 2014-03-30 DEATH — deceased

## 2014-12-17 NOTE — Consult Note (Signed)
PATIENT NAME:  Richard [name], Richard [name] MR#:  [id number] DATE OF BIRTH:  15-Nov-1950  DATE OF CONSULTATION:  04/29/2012  REFERRING PHYSICIAN:  Lowella FairyJohn Woodruff, MD CONSULTING PHYSICIAN:  Davina Pokehapman T. Ritisha Deitrick, MD  REASON FOR CONSULTATION: Possible fistula.   HISTORY OF PRESENT ILLNESS: This is a 64 year old gentleman a patient of Dr. 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 He did well until about a year and a half ago developed worsening symptoms. He was reevaluated and had recurrent cancer. Approximately one year ago he underwent total laryngectomy by Dr. Elenore RotaJuengel as well as G-tube and had been doing well until about a week to 10 days ago when he developed some redness and swelling just above the trach site. Dr. Elenore RotaJuengel had started him on some antibiotics, Septra suspension, through his G-tube. He came into the ED today and noticed that when he swallowed he had some drainage from his neck.   PAST MEDICAL HISTORY: As noted and most significantly for laryngeal cancer. I did not have a full history available and could not get that from him.   REVIEW OF SYSTEMS: Unremarkable. He had no significant neck swelling.   LABORATORY DATA: His white count was slightly elevated at 11.3 today.  PHYSICAL EXAMINATION: On my exam, his external ears were clear. The anterior nose was benign. The oral cavity and oropharynx showed dry mucous membranes but no evidence of obvious tumor. On examination of the neck, he had a nice clean stoma. Just above the stoma approximately 1.5 cm and slightly to the left he had an obvious fistula. This was not draining directly into the stoma.  RADIOLOGIC DATA: He had a CT scan which I reviewed with both Dr. Margarita GrizzleWoodruff and Dr. Excell Seltzerooper. He has an obvious pharyngocutaneous fistula draining just to the left and superior to the stoma. There is also a bit of soft tissue  swelling, what appears to be around the tongue base, more towards the right.   IMPRESSION AND RECOMMENDATIONS: Obvious pharyngocutaneous fistula. There is no evidence of infection at this point. We did take a culture of the debris coming from the fistula tract. We have switched him over from Septra suspension to Cipro suspension to get a little broader coverage for gram negatives. This will be given in suspension through his G-tube. I do not see any need for him to come into the hospital at this point as there is not any significant infection that I could detect, no abscess. We will continue to keep him strictly n.p.o. I have talked to he and his mom about keeping his stoma site suctioned cleanly and when he is lying supine to be lying on his left hand side so that this fistula tract does not drain in the stoma. We will have him follow up with Dr. Elenore RotaJuengel on Tuesday. ____________________________ Davina Pokehapman T. Maylon Sailors, MD ctm:slb [name]: 04/29/2012 14:58:00 ET T: 04/30/2012 14:10:53 ET JOB#: [id number]  cc: Davina Pokehapman T. Tacari Repass, MD, <Dictator> Davina PokeHAPMAN T Jaculin Rasmus MD ELECTRONICALLY SIGNED 05/05/2012 8:44

## 2014-12-22 NOTE — Op Note (Signed)
PATIENT NAME:  Richard [name], Richard [name] MR#:  [id number] DATE OF BIRTH:  01/02/1951  DATE OF PROCEDURE:  11/08/2011  PREOPERATIVE DIAGNOSES:  1. 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 2. Esophageal stricture.   POSTOPERATIVE DIAGNOSES:  1. 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 2. Esophageal stricture.   PROCEDURES:  1. Direct laryngoscopy.  2. Upper gastrointestinal endoscopy and retrograde esophagoscopy with placement of a wire through the stenosis.  3. NG tube placement over the feeding tube and feeding this into the stomach under direct vision with direct laryngoscopy.  SURGEON: Margaretha Sheffield, MD  CO-SURGEON: Sherri Rad, MD  ANESTHESIA: General.   COMPLICATIONS: None.   DESCRIPTION OF PROCEDURE: The patient was brought to the Operating Room and placed in the supine position. The patient was given general anesthesia by placing an oral endotracheal tube through the tracheal stoma in his neck. He had previous total laryngectomy six months ago. Once he was given anesthesia, a Dedo laryngoscope was used for visualization of the hypopharynx. A Louie arm was used for stabilization. The larynx was previously removed. There was a ball of granulation tissue at the base of the neopharynx, but it seems to be pretty solid here with biopsying some of the granulation tissue away.   Dr. Sherri Rad will dictate his part of the procedure elsewhere. He was able to remove the gastrostomy tube and dilate the opening. He was able to place a pediatric GI endoscopy scope into the stomach. He was able to find the GE sphincter and pass the scope retrograde up into the esophagus and passed up until he met resistance. This was just below the level of the neopharynx. You could see the lights from the outside, they were almost matching, and there was just a small area of scar tissue between the two. He tried passing the scope and it was too hard.  He passed a small biopsy forceps through the scope and tried pushing it through, but again the scar tissue was too hard. Using cup biting forceps, I was able bite away some of the scar tissue from above and eventually he was able to push right through into the pharynx with the biopsy forceps. Once it was through in the pharynx I could bring it out the scope. We then took a 69 French NG tube and passed it over the suction catheter. I was able to pass it over the suction catheter and through the scarred area and into the esophagus. We decided to try a little bigger tube and slid the 14 [place] off and I used a Jackson laryngoscope then to visualize this area, again with the Louie arm, and used a 16 French tube to pass this over the biopsy wire. I was able to pass this again into the esophagus and down into the stomach. It was visualized all the way down into the stomach and it was set at 50 to 55 cm at the neopharynx. This was brought out through the nose and was secured to the tip of the nose. Dr. Marina Gravel then finished the procedure with placement of a small G-tube and securing it to the skin. The patient tolerated the procedure well. He was awakened and taken to the Recovery Room in satisfactory condition. There were no operative complications. Total estimated blood loss was less than 50 mL. He had some air in his stomach and in the Recovery Room an abdominal flat plate will be obtained to make  sure there is no free air and that the tube is in the stomach. ____________________________ Huey Romans, MD phj:slb D: 11/08/2011 09:38:08 ET T: 11/08/2011 10:34:48 ET JOB#: [id number]  cc: Huey Romans, MD, <Dictator> Huey Romans MD ELECTRONICALLY SIGNED 11/08/2011 18:52

## 2014-12-22 NOTE — Op Note (Signed)
PATIENT NAME:  [name], Richard D MR#:  [id number] DATE OF BIRTH:  1951-03-11  DATE OF PROCEDURE:  11/08/2011  PREOPERATIVE DIAGNOSIS: Esophageal stenosis status post total laryngectomy.   POSTOPERATIVE DIAGNOSIS:  Esophageal stenosis status post total laryngectomy.   PROCEDURES PERFORMED:  1. Upper endoscopy via gastric stoma.  2. Replacement of gastric feeding tube.   SURGEON:  Raynald KempMark A, Dinh Ayotte, [name].  ANESTHESIA: General.   FINDINGS: As per Dr. Wyn ForsterJuengel's note.  ESTIMATED BLOOD LOSS: Minimal.   DESCRIPTION OF PROCEDURE: With the patient in the supine position and general anesthesia after being induced, the existing G-tube was sterilely prepped and draped with Betadine solution. Time-out procedure was observed. The balloon was deflated on the Moss tube and the tube was removed. Sequential dilatation with Hegar dilators was performed. Still I could not place the endoscope through the stoma. In order to accomplish this, a small incision in the dermis was fashioned with electrocautery. I then was able to get the endoscope into the stomach and with much negotiation I was able to intubate the pylorus and the duodenum appeared to be normal. Grossly, the stomach appeared to be unremarkable. I could not find the gastroesophageal junction. The scope was then withdrawn to just inside the stomach and then blindly inserted and by chance entered the esophagus. The esophagus was then negotiated to the stricture. Dr. Elenore RotaJuengel then performed rigid esophagoscopy. Transillumination of the neopharynx demonstrated the stricture to be very proximal. Biopsy forceps were then placed near the stricture and Dr. Elenore RotaJuengel was able to negotiate the stricture from above. Please see his dictation. Once the nasogastric tube was passed from the neopharynx to the proximal esophagus, a 18 French G-tube with flange was inserted into the stomach. Balloon insufflated and secured at the skin site with nylon suture. It     was placed to  gravity drainage. The patient was then subsequently taken to the recovery room by anesthesia services in stable and satisfactory condition.   ____________________________ Redge GainerMark A. Egbert GaribaldiBird, [name] mab:ap D: 11/08/2011 09:41:53 ET T: 11/08/2011 10:30:13 ET JOB#: [id number]  cc: Richard LericheMark A. Egbert GaribaldiBird, [name], <Dictator> Raynald KempMARK A [place] [name] ELECTRONICALLY SIGNED 11/08/2011 17:44

## 2014-12-22 NOTE — Op Note (Signed)
PATIENT NAME:  Richard [name], [name] MR#:  [id number] DATE OF BIRTH:  [birth year]  DATE OF PROCEDURE:  02/21/2012  PREOPERATIVE DIAGNOSIS: Cervical esophageal stricture.   POSTOPERATIVE DIAGNOSIS: Cervical esophageal stricture.  OPERATIVE PROCEDURE: Esophagoscopy and esophageal dilation.   SURGEON: Cammy CopaPaul H. Duana Benedict, MD   ANESTHESIA: General.   PROCEDURE: The patient was given general anesthesia through the permanent tracheostomy site because of previous laryngectomy. He had scar tissue in the neopharynx secondary to previous laryngectomy and radiation therapy twice to the neck. He had recurrent cancer there. He had an NG tube that was removed last week. There was good opening up into the esophagus. The patient once pulled the NG tube out, just could not seem to get anything to go down. It was felt that he was developing a stricture in the area.   Once the patient was asleep with the tube placed into the permanent tracheostomy site, an anterior commissure scope was used to visualize the area. It was scarred down. There was a small opening approximately 1/4-inch in size. A pediatric laryngoscope was used for visualizing the area. It was too tight for this to go through. There appeared to be a little granulation tissue inside the opening. I was able to stick a small flexible guidewire through this down the cervical esophagus without any difficulty at all. A bougie was to be threaded over the top of this, however, it would not go through the esophagoscope I had to use the CollinsJackson scope to be able to see the area, thread the wire through, and then thread the bougie over the guidewire down into the cervical esophagus. I was able to slide through with minimal resistance to begin. It was able to go in approximately 3 or 4 inches, and at that point it got big enough that it became extremely tight. I was able to push on it a little bit and help to stretch this out. Once I allowed this to sit for a while and then  pulled it back, I used suction, and now with the scope you could see down into the upper esophagus. It was very scarred in this area, but my opening was about 3/8 of an inch but very, very firm. There was no sign of any cancer that I could see at this area.   The patient tolerated the procedure well. This was done in the Operating Room. There were no operative complications. He was awakened and taken to the recovery room in satisfactory condition.   ____________________________ Cammy CopaPaul H. Tiernan Millikin, MD phj:cbb D: 02/21/2012 12:30:30 ET T: 02/21/2012 13:00:54 ET JOB#: [id number]  cc: Cammy CopaPaul H. Alixandrea Milleson, MD, <Dictator> Cammy CopaPAUL H Mylen Mangan MD ELECTRONICALLY SIGNED 02/24/2012 8:05

## 2014-12-22 NOTE — Consult Note (Signed)
Reason for Visit: This 64 year old Male patient presents to the clinic for initial evaluation of  Recurrent head and neck cancer .   Referred by Dr. Doylene Canninghoksi.  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 He received 6700 cGy the combination of large field and IMRT boost treatment. He developed recurrence in October of 2012 status post laryngectomy. Recently was noted to have granulation tissue and scarring of the posterior oropharynx near the base of the tongue. He was taken to surgery and was found to have again recurrent squamous cell carcinoma basal a tight with marked scar tissue. He also had a G-tube feeding tube placed. Recent PET/CT scan shows avid uptake in the area of the larynx corresponding to the area of granulation tissue seen at the time of surgery. Patient is seen today for opinion regarding salvage radiation therapy. He is doing well with the feeding tube. He is having no head and neck pain no other dominant aphasia. PET CT scan did not demonstrate any evidence of adenopathy in the neck.  Past Hx:    Hypothyroidism:    indigestion:    COPD per CXR:    Chronic cough:    vocal chord paralysis:    Hypertension:    Laryngeal Cancer: 2006   G tube insertion: 08-Nov-2011   Cardiac Stent: 1994   Total Laryngectomy: Aug 2012   G-Tube: Aug 2012   Laryngoscopy with biopsy of glottic mass: Aug 2012   Tracheostomy: 2011   throat biopsy:   Past, Family  and Social History:   Past Medical History positive    Cardiovascular coronary artery disease; coronary artery stents; hypertension    Respiratory COPD    Gastrointestinal GERD    Endocrine hypothyroidism    Past Surgical History Tracheostomy as described above, G-tube placement    Family History noncontributory    Social History noncontributory    Additional Past Medical and Surgical History Accompanied by 2 family members today   Allergies:   No Known Allergies:   Home Meds:  Home Medications: Medication Instructions Status  levothyroxine 50 mcg (0.05 mg) oral tablet 1 tab(s)  once a day via G-Tube Active   Review of Systems:   General negative    Performance Status (ECOG) 0    Skin negative    Breast negative    Ophthalmologic negative    ENMT see HPI    Respiratory and Thorax negative    Cardiovascular see HPI    Gastrointestinal see HPI    Genitourinary negative    Musculoskeletal negative    Neurological negative    Psychiatric negative    Hematology/Lymphatics negative    Endocrine see HPI    Allergic/Immunologic negative   Nursing Notes:  Nursing Vital Signs and Chemo Nursing Nursing Notes: *CC Vital Signs Flowsheet:   04-Apr-13 14:34   Temp Temperature 97   Pulse Pulse 71   Respirations Respirations 20   SBP SBP 141   DBP DBP 72   Pain Scale (0-10)  1   Current Weight (kg) (kg) 63.3   Height (cm) centimeters 170.1   BSA (m2) 1.7   Physical Exam:  General/Skin/HEENT:   General normal    Eyes normal    Additional PE Well-developed male in NAD. Oral cavity is clear. No oral mucosal lesions are identified. Patient has a G-tube placed transnasal. Indirect mirror examination shows upper airway clear except for extensive scar tissue in the posterior oropharynx. He is status post laryngectomy. Tracheostomy appears to be functioning well. No evidence of mass or adenopathy in the neck is noted. He does have significant scar tissue  present. No supraclavicular adenopathy is identified. Lungs are clear to A&P cardiac examination shows regular rate and rhythm.   Breasts/Resp/CV/GI/GU:   Respiratory and Thorax normal    Cardiovascular normal    Gastrointestinal normal    Genitourinary normal   MS/Neuro/Psych/Lymph:   Musculoskeletal normal    Neurological normal    Lymphatics normal   Other Results:  Radiology Results: Nuclear Med:    03-Apr-13 14:38, PET/CT Scan Head/Neck CA Restaging   PET/CT Scan Head/Neck CA Restaging    REASON FOR EXAM:    Restaging Recurrent Head Neck CA  COMMENTS:       PROCEDURE: PET - PET/CT RESTG HEAD/NECK CA  - Dec 01 2011  2:38PM     RESULT:     HISTORY:  Head and neck cancer.    COMPARISON STUDY:   PET CT of 04/13/2011.    PROCEDURE AND FINDINGS:  Standard PET CT was obtained following   administration of 12.08 mCi of F-18 FDG. Fasting blood sugar of 66 mg/dL   was determined. CT obtained for attenuation correction and fusion. High   resolution images were obtained of the neck. Intense activity is noted     about the tracheostomy tube at the level of the thyroid cartilage. This   could be soft tissue inflammatory change. Malignancy cannot be excluded.   No other focal abnormality is identified.    IMPRESSION:      Intense activity noted in the soft tissues about the tracheostomy tube at   the level of thyroid cartilage. This could represent tumor or   inflammatory change.    Thank you for the opportunity to contribute to the care of your patient.           Verified By: Gwynn Burly, M.D., MD   Assessment and Plan:  Impression:   recurrence or cell carcinoma of the larynx and 64 year old male status post IMRT radiation in 2005 and total laryngectomy in 2000 in 12  Plan:   I have reviewed the PET/CT and operative report on the patient and spoke personally with Dr. Genevive Bi and Dr. Doylene Canning. I feel I can offer salvage radiation therapy up to 6000 cGy using  IMRT to treat the area of avid uptake on PET/CT which clearly delineates the area of recurrence. Certainly patient is at high risk for fistula formation as well as significant secondary scarring although salvage radiation head and neck is recommended in this case 2nd or 2 biopsy-proven recurrent disease. Risks and benefits of treatment including some skin reaction as well as fatigue and all the side effects or week her radiating tissue were discussed at length with the patient and his family. Also discuss with Dr. Doylene Canning giving Erbitux along with radiation. I have set him up for CT simulation early next week.  I would like to take this opportunity to thank you for allowing me to continue  to participate in this patient's care.  CC Referral:   cc: Dr. Genevive Bi   Electronic Signatures: Rushie Chestnut, Gordy Councilman (MD)  (Signed 04-Apr-13 15:39)  Authored: HPI, Diagnosis, Past Hx, PFSH, Allergies, Home Meds, ROS, Nursing Notes, Physical Exam, Other Results, Encounter Assessment and Plan, CC Referring Physician   Last Updated: 04-Apr-13 15:39 by Rebeca Alert (MD)
